# Patient Record
Sex: Male | Born: 1995 | Race: White | Hispanic: No | Marital: Single
Health system: Southern US, Community
[De-identification: ages and names within clinical notes are randomized; demographics above are authoritative.]

---
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
## 2017-07-16 ENCOUNTER — Other Ambulatory Visit: Payer: Self-pay | Admitting: Family Medicine

## 2017-07-16 ENCOUNTER — Ambulatory Visit
Admission: RE | Admit: 2017-07-16 | Discharge: 2017-07-16 | Disposition: A | Discharge status: Home / Self Care | Payer: BLUE CROSS/BLUE SHIELD | Source: Ambulatory Visit | Attending: Family Medicine | Admitting: Family Medicine

## 2017-07-16 DIAGNOSIS — R609 Edema, unspecified: Secondary | ICD-10-CM

## 2017-07-16 DIAGNOSIS — M25571 Pain in right ankle and joints of right foot: Secondary | ICD-10-CM

## 2019-11-28 IMAGING — CR DG ANKLE COMPLETE 3+V*R*
3 series · 3 of 3 positions shown · non-contrast
Comparison: None.

CLINICAL DATA: 21-year-old male status post twisting injury
yesterday with lateral malleolus soft tissue swelling, contusion,
pain.

EXAM:
RIGHT ANKLE - COMPLETE 3+ VIEW

[x ankle ap right]
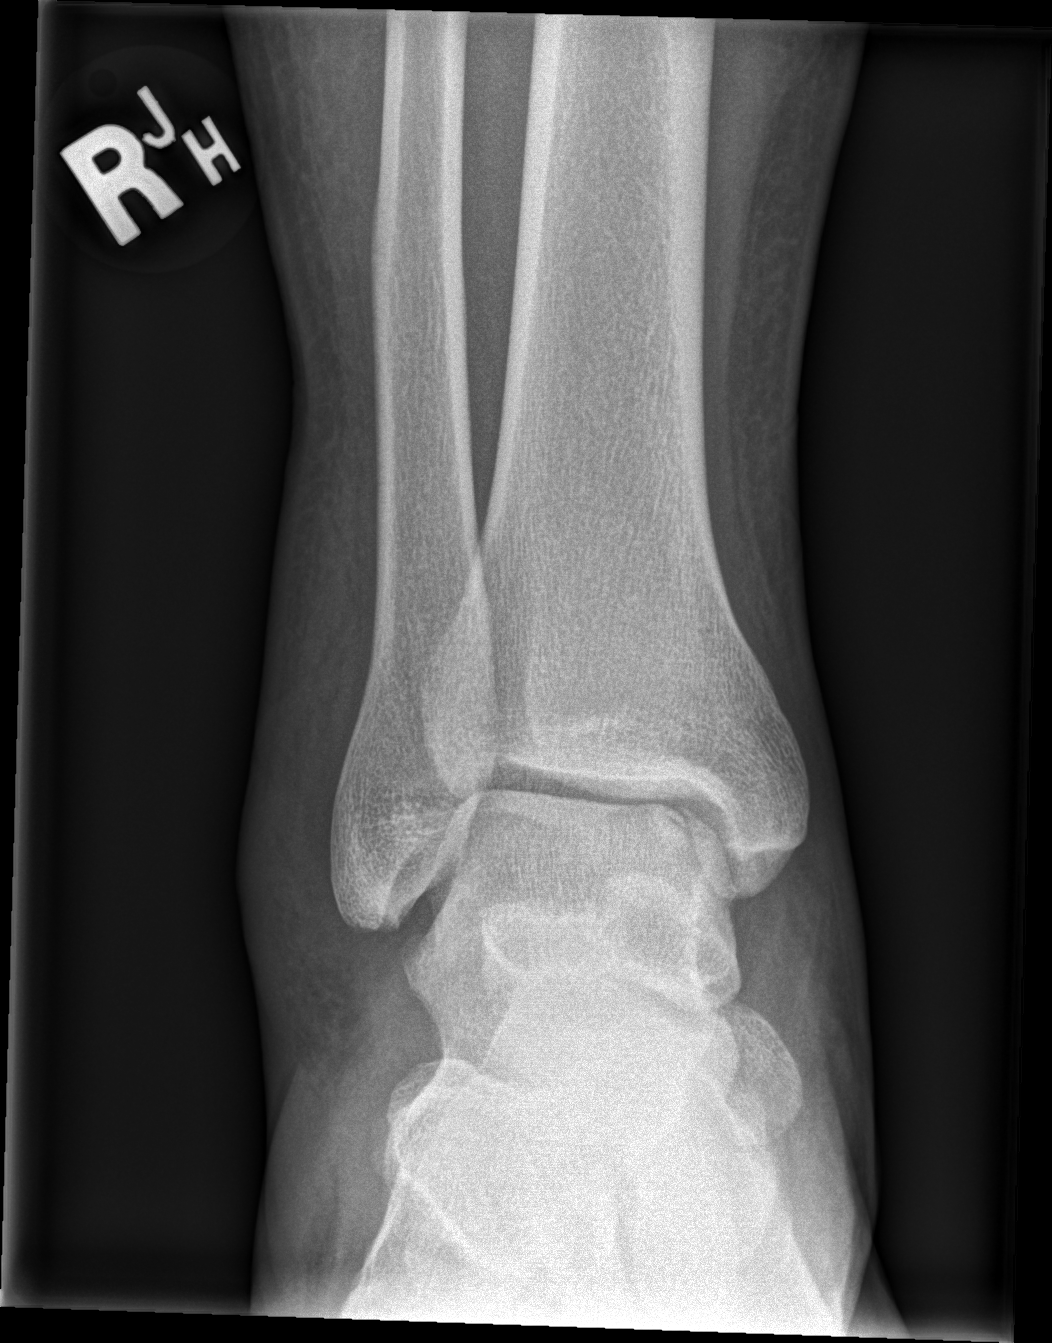

[x ankle obl right]
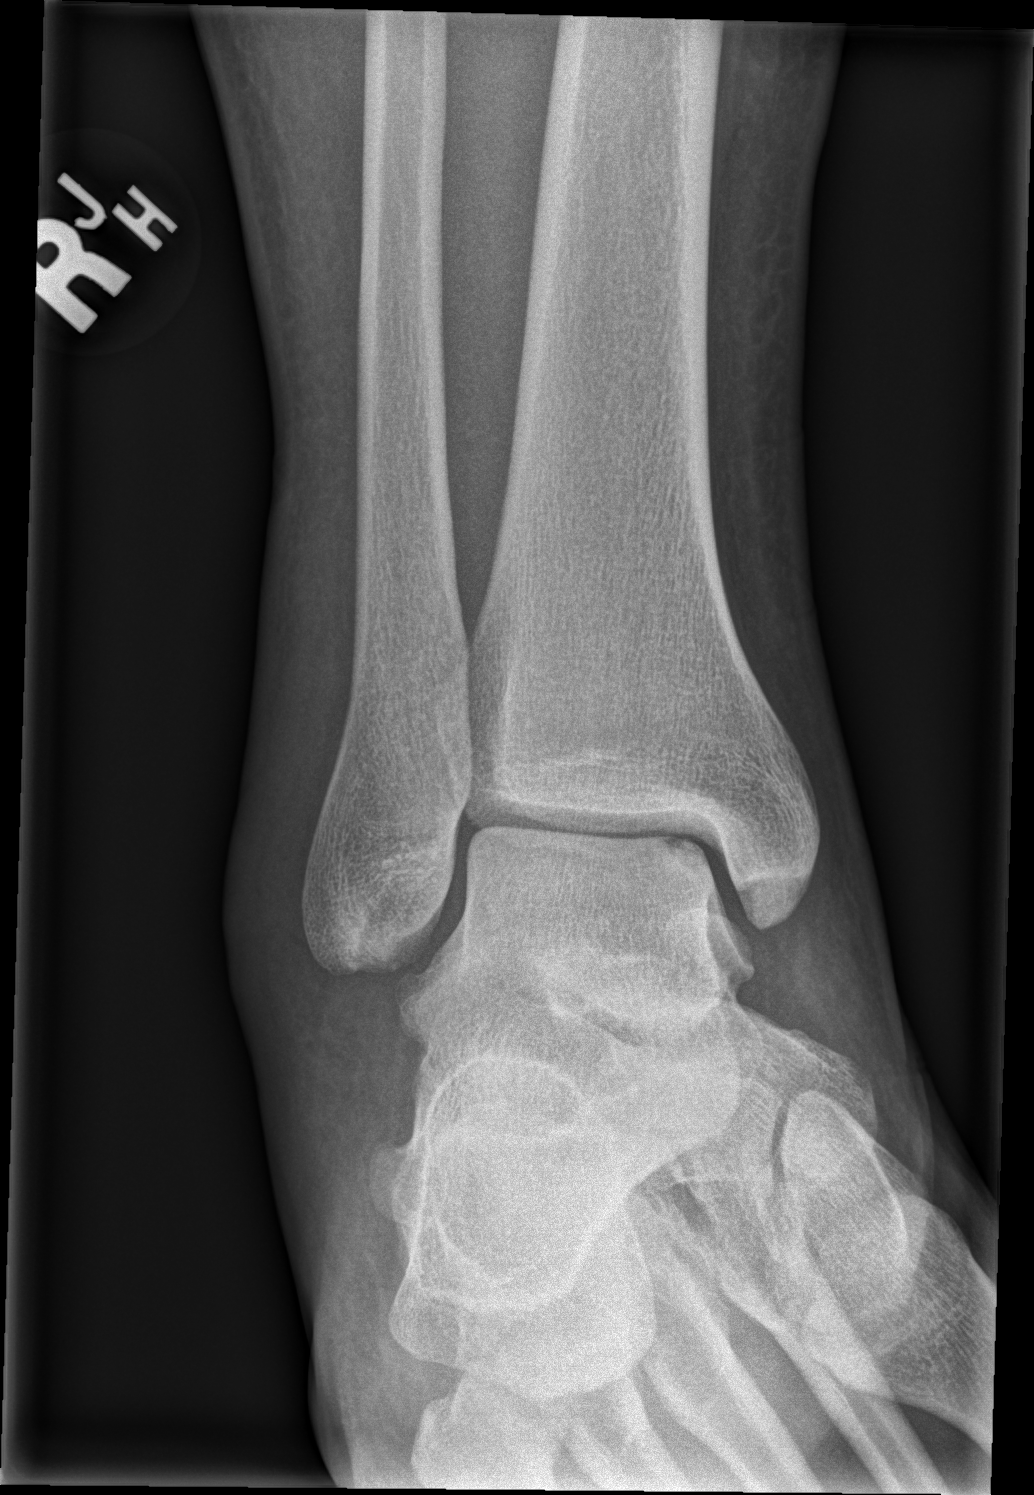

[x ankle lat right]
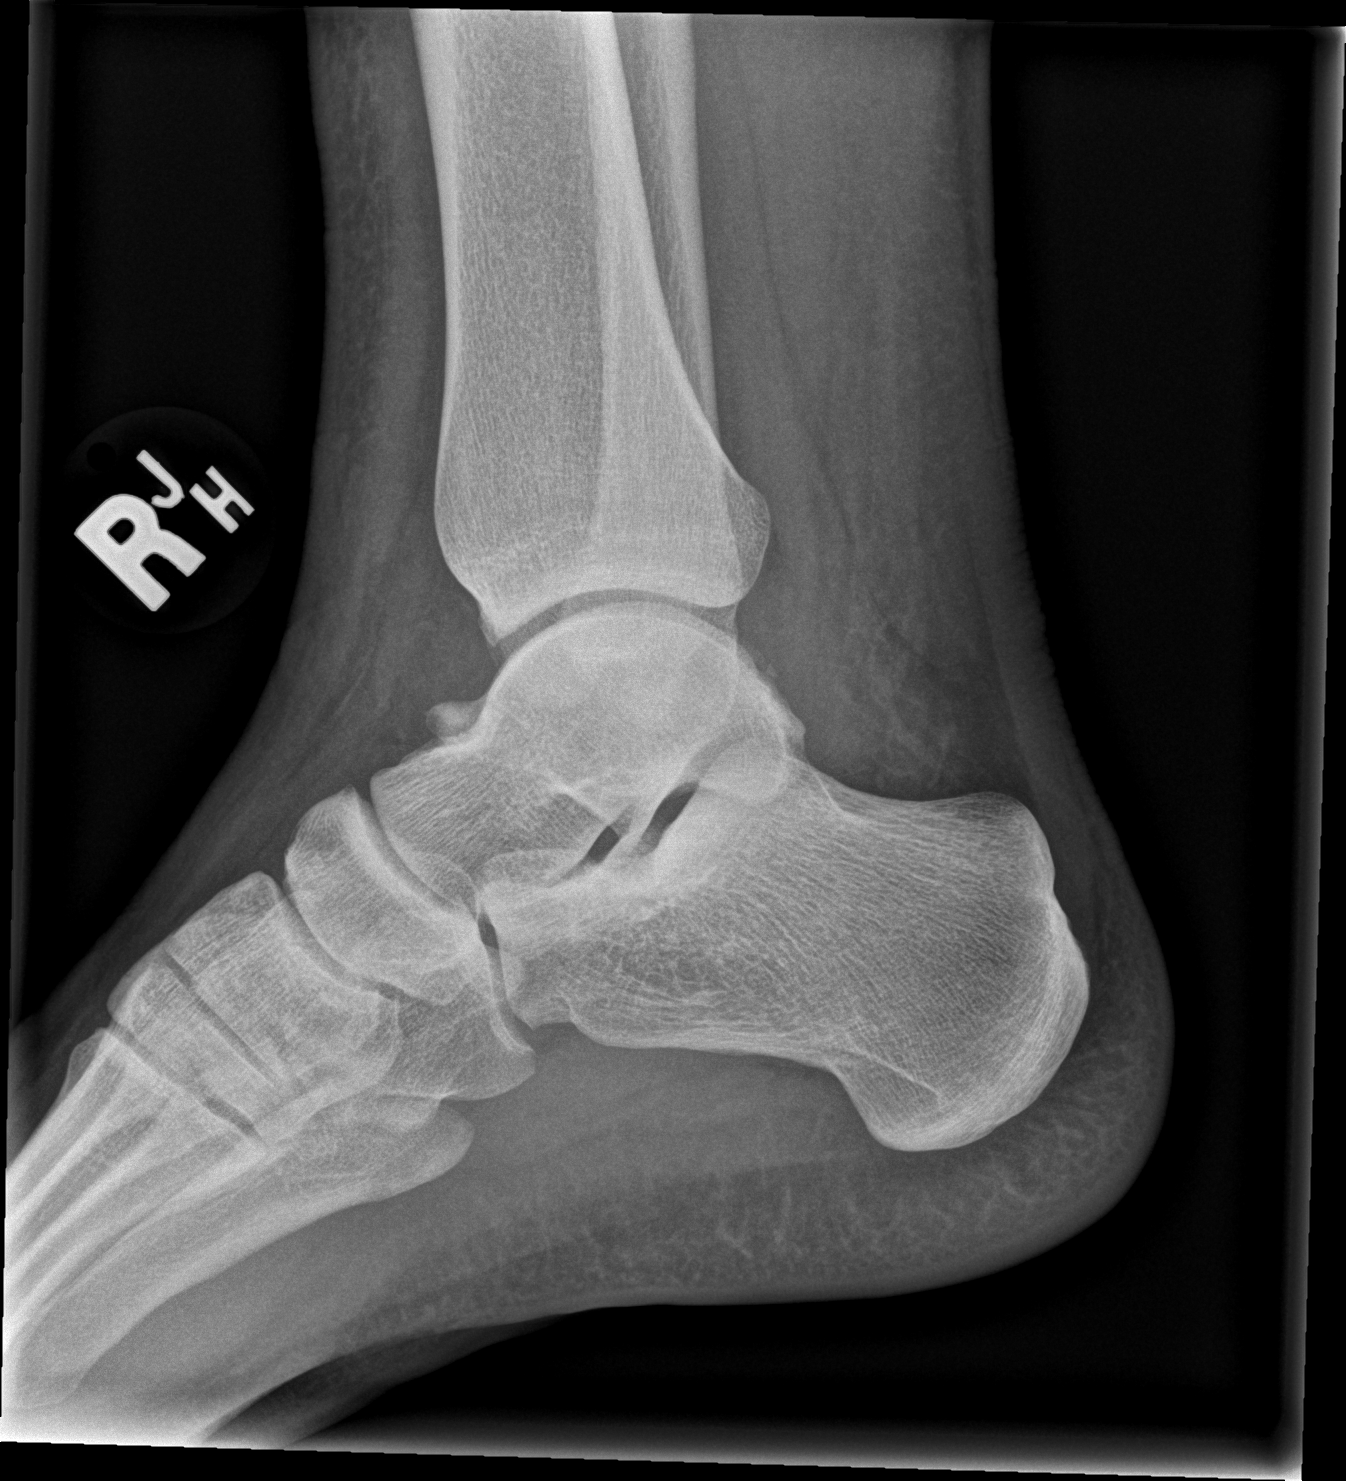

[3 of 3 positions shown; findings below may reference images not displayed]

FINDINGS: Bone mineralization is within normal limits for age. Skeletally
immature.

Soft tissue swelling appears maximal about the lateral malleolus. No
definite joint effusion.

Mortise joint alignment is preserved but there is abnormal
heterogeneity along the medial talar dome (arrow) compatible with
osteochondral fracture at that site. The remainder of the talar dome
appears intact. No fracture of the distal fibula or lateral talus
identified. The distal tibia and calcaneus appear intact. There is
chronic appearing anterior talus osteophytosis. Visible bones of the
left foot appear intact.
IMPRESSION: 1. Medial talar dome osteochondral fracture.
2. No other acute fracture or dislocation identified. Mortise joint
alignment preserved.
3. Soft tissue swelling, maximal laterally.

## 2022-06-04 ENCOUNTER — Other Ambulatory Visit: Payer: Self-pay

## 2022-06-04 ENCOUNTER — Encounter (HOSPITAL_BASED_OUTPATIENT_CLINIC_OR_DEPARTMENT_OTHER): Payer: Self-pay

## 2022-06-04 ENCOUNTER — Emergency Department (HOSPITAL_BASED_OUTPATIENT_CLINIC_OR_DEPARTMENT_OTHER)
Admission: EM | Admit: 2022-06-04 | Discharge: 2022-06-04 | Disposition: A | Discharge status: Home / Self Care | Payer: Federal, State, Local not specified - PPO | Attending: Emergency Medicine | Admitting: Emergency Medicine

## 2022-06-04 DIAGNOSIS — R002 Palpitations: Secondary | ICD-10-CM | POA: Insufficient documentation

## 2022-06-04 DIAGNOSIS — Z20822 Contact with and (suspected) exposure to covid-19: Secondary | ICD-10-CM | POA: Insufficient documentation

## 2022-06-04 LAB — BASIC METABOLIC PANEL
Anion gap: 6 (ref 5–15)
BUN: 13 mg/dL (ref 6–20)
CO2: 29 mmol/L (ref 22–32)
Calcium: 9.6 mg/dL (ref 8.9–10.3)
Chloride: 103 mmol/L (ref 98–111)
Creatinine, Ser: 0.95 mg/dL (ref 0.61–1.24)
GFR, Estimated: >60 mL/min (ref >60)
Glucose, Bld: 134 mg/dL — ABNORMAL HIGH (ref 70–99)
Potassium: 4.1 mmol/L (ref 3.5–5.1)
Sodium: 138 mmol/L (ref 135–145)

## 2022-06-04 LAB — RESP PANEL BY RT-PCR (RSV, FLU A&B, COVID)  RVPGX2
Influenza A by PCR: NEGATIVE
Influenza B by PCR: NEGATIVE
Resp Syncytial Virus by PCR: NEGATIVE
SARS Coronavirus 2 by RT PCR: NEGATIVE

## 2022-06-04 LAB — CBC
HCT: 43.9 % (ref 39.0–52.0)
Hemoglobin: 15.5 g/dL (ref 13.0–17.0)
MCH: 29.5 pg (ref 26.0–34.0)
MCHC: 35.3 g/dL (ref 30.0–36.0)
MCV: 83.5 fL (ref 80.0–100.0)
Platelets: 291 10*3/uL (ref 150–400)
RBC: 5.26 MIL/uL (ref 4.22–5.81)
RDW: 11.6 % (ref 11.5–15.5)
WBC: 7.4 10*3/uL (ref 4.0–10.5)
nRBC: 0 % (ref 0.0–0.2)

## 2022-06-04 LAB — TROPONIN I (HIGH SENSITIVITY): Troponin I (High Sensitivity): <2 ng/L (ref <18)

## 2022-06-04 NOTE — ED Triage Notes (Signed)
Pt c/o heart palpitations x1 day. States he feels "a skip in the beat; I'm not lightheaded or dizzy, just taken aback by it." Denies associated SHOB, additional symptoms, states he's been "dealing w cold symptoms" recently.

## 2022-06-04 NOTE — Discharge Instructions (Signed)
You were evaluated the emergency department today for palpitations.  Your caregiver has diagnosed you as having palpitations that are nonspecific for one problem. This means that after looking at you and examining you and ordering tests (such as blood work, chest x-rays and EKG), your caregiver does not believe that the problem is serious enough to need watching in the hospital. This judgment is often made after testing shows no acute heart attack and you are at low risk for sudden acute heart condition. Palpitations come from many different causes.  Further information on palpitations have been provided for you to review.  Seek immediate medical attention if:  You have severe chest pain, especially if the pain is crushing or pressure-like and spreads to the arms, back, neck, or jaw, or if you have sweating, nausea, shortness of breath. This is an emergency. Don't wait to see if the pain will go away. Get medical help at once. Call 911 immediately. Do not drive herself to the hospital.  Your chest pain gets worse and does not go away with rest.  You have an attack of chest pain lasting longer than usual, despite rest and treatment with the medications your caregiver has prescribed  You awaken from sleep with chest pain or shortness of breath.  You feel faint or dizzy  You have chest pain not typical of your usual pain for which you originally saw your caregiver.  You must have a repeat evaluation within 2-3 days for a recheck of your heart.  Please call your doctor in [place] today to schedule this appointment.  At which point it may be considered beneficial to follow up with cardiology.  You have also been provided the contact information for 2 local PCP offices should you stay longer in need to follow-up locally.  Return to the ED for new or worsening symptoms as discussed.

## 2022-06-04 NOTE — ED Provider Notes (Signed)
[facility] Provider Note   CSN: [id number] Arrival date & time: 06/04/22  1221     History  Chief Complaint  Patient presents with   Palpitations    Tony Henry is a 27 y.o. male with no known medical history presenting to the ED due to palpitations over the last 24 hours.  Described as fleeting, without associated pain or other symptoms.  Occurs at random.  Patient states he has noticed that his heart "skips a beat".  Denies dizziness, lightheadedness, shortness of breath, chest pain, syncope.  Endorses recent URI symptoms.  Denies change in caffeine intake, however reports the recent loss of his father last week and associated changes in sleep schedule and eating habits.  Reports eating fast food daily, though still with adequate water intake.  Patient lives in [place] and was this past week for his father, plans to head back to [place] this coming Monday.  The history is provided by the patient and medical records.  Palpitations     Home Medications Prior to Admission medications   Not on File      Allergies    Patient has no allergy information on record.    Review of Systems   Review of Systems  Cardiovascular:  Positive for palpitations.    Physical Exam Updated Vital Signs BP (!) 151/123   Pulse 79   Temp 98.4 F (36.9 C)   Resp 17   SpO2 97%  Physical Exam Vitals and nursing note reviewed.  Constitutional:      General: He is not in acute distress.    Appearance: He is well-developed. He is obese. He is not ill-appearing, toxic-appearing or diaphoretic.  HENT:     Head: Normocephalic and atraumatic.  Eyes:     General: No scleral icterus.    Conjunctiva/sclera: Conjunctivae normal.  Cardiovascular:     Rate and Rhythm: Normal rate and regular rhythm.     Pulses: Normal pulses.     Heart sounds: Normal heart sounds. No murmur heard. Pulmonary:     Effort: Pulmonary effort is normal. No respiratory  distress.     Breath sounds: Normal breath sounds.     Comments: CTAB, communicating without difficulty, without increased respiratory effort.  Adequate air movement.  Equal chest rise. Chest:     Chest wall: No tenderness.  Abdominal:     General: Abdomen is protuberant.     Palpations: Abdomen is soft.     Tenderness: There is no abdominal tenderness.  Musculoskeletal:        General: No swelling.     Cervical back: Neck supple. No rigidity.     Right lower leg: No edema.     Left lower leg: No edema.  Skin:    General: Skin is warm and dry.     Capillary Refill: Capillary refill takes less than 2 seconds.     Coloration: Skin is not jaundiced or pale.     Findings: No erythema.  Neurological:     Mental Status: He is alert and oriented to person, place, and time.  Psychiatric:        Mood and Affect: Mood normal.     ED Results / Procedures / Treatments   Labs (all labs ordered are listed, but only abnormal results are displayed) Labs Reviewed  BASIC METABOLIC PANEL - Abnormal; Notable for the following components:      Result Value   Glucose, Bld 134 (*)    All  other components within normal limits  RESP PANEL BY RT-PCR (RSV, FLU A&B, COVID)  [id number]  CBC  TROPONIN I (HIGH SENSITIVITY)  TROPONIN I (HIGH SENSITIVITY)    EKG EKG Interpretation  Date/Time:  Thursday June 04 2022 12:27:50 EST Ventricular Rate:  92 PR Interval:  157 QRS Duration: 92 QT Interval:  362 QTC Calculation: 448 R Axis:   69 Text Interpretation: Sinus rhythm minimal inferior Q waves Confirmed by Garnette Gunner [phone]) on 06/04/2022 3:51:03 PM  Radiology No results found.  Procedures Procedures    Medications Ordered in ED Medications - No data to display  ED Course/ Medical Decision Making/ A&P                             Medical Decision Making Amount and/or Complexity of Data Reviewed Labs: ordered.   Patient is a 27 year old male presenting to the ED with  occasional palpitations over the last few days.  Reports increased stress at home with recent passing of his father last week.  Also endorses recent change in eating habits, with a lot of fast food, and change in sleep habits as well.  Hx of early family cardiac history, father passed away at the age of 25 due to CHF.  Reports mild consumption of caffeine without change.  Without chest pain, shortness of breath, N/V, dizziness, lightheadedness, syncope, or URI symptoms.    HEART score of 0, low risk.  Considered ASA and nitroglycerin, however do not seem appropriate at this time.  PERC negative.  Well's score 0, low probability.  Labs unremarkable.  Respiratory panel negative.  Again without chest pain or shortness of breath.  Unlikely dissection, no pulse deficit, no tearing chest pain, no neurologic complaints.  EKG findings indicate without evidence of acute ischemic changes, abnormal intervals, or dysrhythmia.  No prior to compare.  Troponin <2.    Discussed many different causes of palpitations with the patient, low suspicion for ACS or arrhythmia at this time.  With noted changes in nutrition, sleep habits, and significant life stressors, these may be contributory to his palpitations.  Overall, I am uncertain the exact etiology of the patient's symptoms.  However, I do not believe he is currently experiencing a medical, surgical, or psychiatric emergency.  Recommend close follow-up with PCP, resources provided.  May consider cardiology referral at that time.  Offered cardiology resources, however patient is not local to the area, is in town for family reasons, and would like to follow up in his hometown in [place].  I find this reasonable.  Strict return precautions discussed at length.  Patient reports satisfaction with today's encounter.  Patient in NAD and good condition at time of discharge.  After consideration the patient's encounter today, I do not feel today's workup suggests an emergent  condition requiring admission or immediate intervention beyond what has been performed at this time.  Safe for discharge; instructed to return immediately for worsening symptoms, change in symptoms or any other concerns.  I have reviewed the patients home medicines and have made adjustments as needed.  Discussed course of treatment with the patient, whom demonstrated understanding.  Patient in agreement and has no further questions.    This chart was dictated using voice recognition software.  Despite best efforts to proofread,  errors can occur which can change the documentation meaning.         Final Clinical Impression(s) / ED Diagnoses Final diagnoses:  Palpitations  Rx / DC Orders ED Discharge Orders     None         Candace Cruise 1919    Cristie Hem, MD 06/05/22 [phone]
